# Patient Record
Sex: Male | Born: 1970 | Hispanic: No | Marital: Married | Smoking: Never smoker
Health system: Southern US, Community
[De-identification: ages and names within clinical notes are randomized; demographics above are authoritative.]

## PROBLEM LIST (undated history)

## (undated) DIAGNOSIS — M419 Scoliosis, unspecified: Secondary | ICD-10-CM

## (undated) HISTORY — DX: Scoliosis, unspecified: M41.9

---

## 2011-12-16 LAB — PULMONARY FUNCTION TEST

## 2012-01-12 ENCOUNTER — Encounter: Payer: Self-pay | Admitting: Emergency Medicine

## 2012-01-13 ENCOUNTER — Ambulatory Visit (INDEPENDENT_AMBULATORY_CARE_PROVIDER_SITE_OTHER)
Admission: RE | Admit: 2012-01-13 | Discharge: 2012-01-13 | Disposition: A | Discharge status: Home / Self Care | Payer: BC Managed Care – PPO | Source: Ambulatory Visit | Attending: Emergency Medicine | Admitting: Emergency Medicine

## 2012-01-13 ENCOUNTER — Ambulatory Visit (INDEPENDENT_AMBULATORY_CARE_PROVIDER_SITE_OTHER): Payer: BC Managed Care – PPO | Admitting: Emergency Medicine

## 2012-01-13 ENCOUNTER — Encounter: Payer: Self-pay | Admitting: Emergency Medicine

## 2012-01-13 VITALS — BP 110/76 | HR 57 | Temp 98.1°F | Ht 70.0 in | Wt 154.6 lb

## 2012-01-13 DIAGNOSIS — J984 Other disorders of lung: Secondary | ICD-10-CM

## 2012-01-13 NOTE — Progress Notes (Signed)
Subjective:     Patient ID: Luis Ellis, male   DOB: [birth year], 41 y.o.   MRN: [id number]  HPI 41 yo man, never smoker, no significant PMH except scoliosis. Underwent spirometry 12/06/11 for insurance application, showed restriction vs mixed disease. He is here to evaluate the test results. He denies any other sx, exercises regularly, no limitations. No wheeze, no cough, no CP. He does have a hx of fairly severe scoliosis that likely is the cause of his restriction.   Review of Systems  Constitutional: Negative for fever and unexpected weight change.  HENT: Negative for ear pain, nosebleeds, congestion, sore throat, rhinorrhea, sneezing, trouble swallowing, dental problem, postnasal drip and sinus pressure.   Eyes: Negative for redness and itching.  Respiratory: Negative for cough, chest tightness, shortness of breath and wheezing.   Cardiovascular: Negative for palpitations and leg swelling.  Gastrointestinal: Negative for nausea and vomiting.  Genitourinary: Negative for dysuria.  Musculoskeletal: Negative for joint swelling.  Skin: Negative for rash.  Neurological: Negative for headaches.  Hematological: Does not bruise/bleed easily.  Psychiatric/Behavioral: Negative for dysphoric mood. The patient is not nervous/anxious.    Past Medical History  Diagnosis Date  . Scoliosis      Family History  Problem Relation Age of Onset  . Cancer Maternal Grandmother     lung  . Diabetes Father      History   Social History  . Marital Status: Married    Spouse Name: N/A    Number of Children: 1  . Years of Education: N/A   Occupational History  . 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  No known TB exposure.   Social History Main Topics  . Smoking status: Former Games developer  . Smokeless tobacco: Never Used  . Alcohol Use: 0.5 oz/week    1 drink(s) per week     1 per day  . Drug Use: No  . Sexually Active: Not on file   Other Topics Concern  . Not on file   Social  History Narrative  . No narrative on file     No Known Allergies   No outpatient prescriptions prior to visit.         Objective:   Physical Exam Filed Vitals:   01/13/12 1342  BP: 110/76  Pulse: 57  Temp: 98.1 F (36.7 C)   Gen: Pleasant, well-nourished, in no distress,  normal affect  ENT: No lesions,  mouth clear,  oropharynx clear, no postnasal drip  Neck: No JVD, no TMG, no carotid bruits  Lungs: No use of accessory muscles, scoliosis with some kyphosis? 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 Could eval for obstruction further, but he has no sx of asthma, etc. I would defer for now. His restriction is almost certainly due to kyphosis/scoliosis. Need to r/u ILD, will screen with a CXR. Other potential cause would be resp muscle weakness but he has no other neuro sx. I think MIP/MEP would low yield. He tells me that he got the desired rate on his life insurance despite the spirometry findings, which seems appropriate.  - CXR now - f/u prn for any changes in his breathing,

## 2012-01-13 NOTE — Patient Instructions (Addendum)
We will perform CXR today Please contact and follow with Dr Delton Coombes if you develop any shortness of breath or changes in your status.

## 2012-01-13 NOTE — Assessment & Plan Note (Addendum)
Luis Ellis looks like restriction +/- mixed disease. Could eval for obstruction further, but he has no sx of asthma, etc. I would defer for now. His restriction is almost certainly due to kyphosis/scoliosis. Need to r/u ILD, will screen with a CXR. Other potential cause would be resp muscle weakness but he has no other neuro sx. I think MIP/MEP would low yield. He tells me that he got the desired rate on his life insurance despite the spirometry findings, which seems appropriate.  - CXR now - f/u prn for any changes in his breathing,

## 2012-01-13 NOTE — Progress Notes (Signed)
Quick Note:  ATC patient, no answer [id number] ______ 

## 2012-01-16 NOTE — Progress Notes (Signed)
Quick Note:  Spoke with patient, informed him of results per Dr. Delton Coombes as listed below. Verbalized understanding and nothing further needed at this time. ______

## 2012-01-31 ENCOUNTER — Encounter: Payer: Self-pay | Admitting: Emergency Medicine

## 2012-01-31 ENCOUNTER — Telehealth: Payer: Self-pay | Admitting: Emergency Medicine

## 2012-01-31 NOTE — Telephone Encounter (Signed)
Spoke with patient in regards to message left. Patient noticed on his MyChart account that we had him listed as Former Smoker and he wanted to verify that he is not a former smoker-he has never smoked. This information was changed within his medical chart and apology to patient in regards to this mistake. I also explained to the patient, while there are no excuses, this information was possibly gathered from his referring physician/PCP in the records that they faxed to [place]. I explained to pt that he might want to communicate with them as well to make sure that their information is also correct. Pt expressed understanding.

## 2013-01-17 ENCOUNTER — Other Ambulatory Visit: Payer: Self-pay

## 2013-01-22 IMAGING — CR DG CHEST 2V
2 series · 2 of 2 positions shown · non-contrast
Comparison: None.

CLINICAL DATA: Restrictive lung

CHEST - 2 VIEW

[view not recorded (1 of 2)]
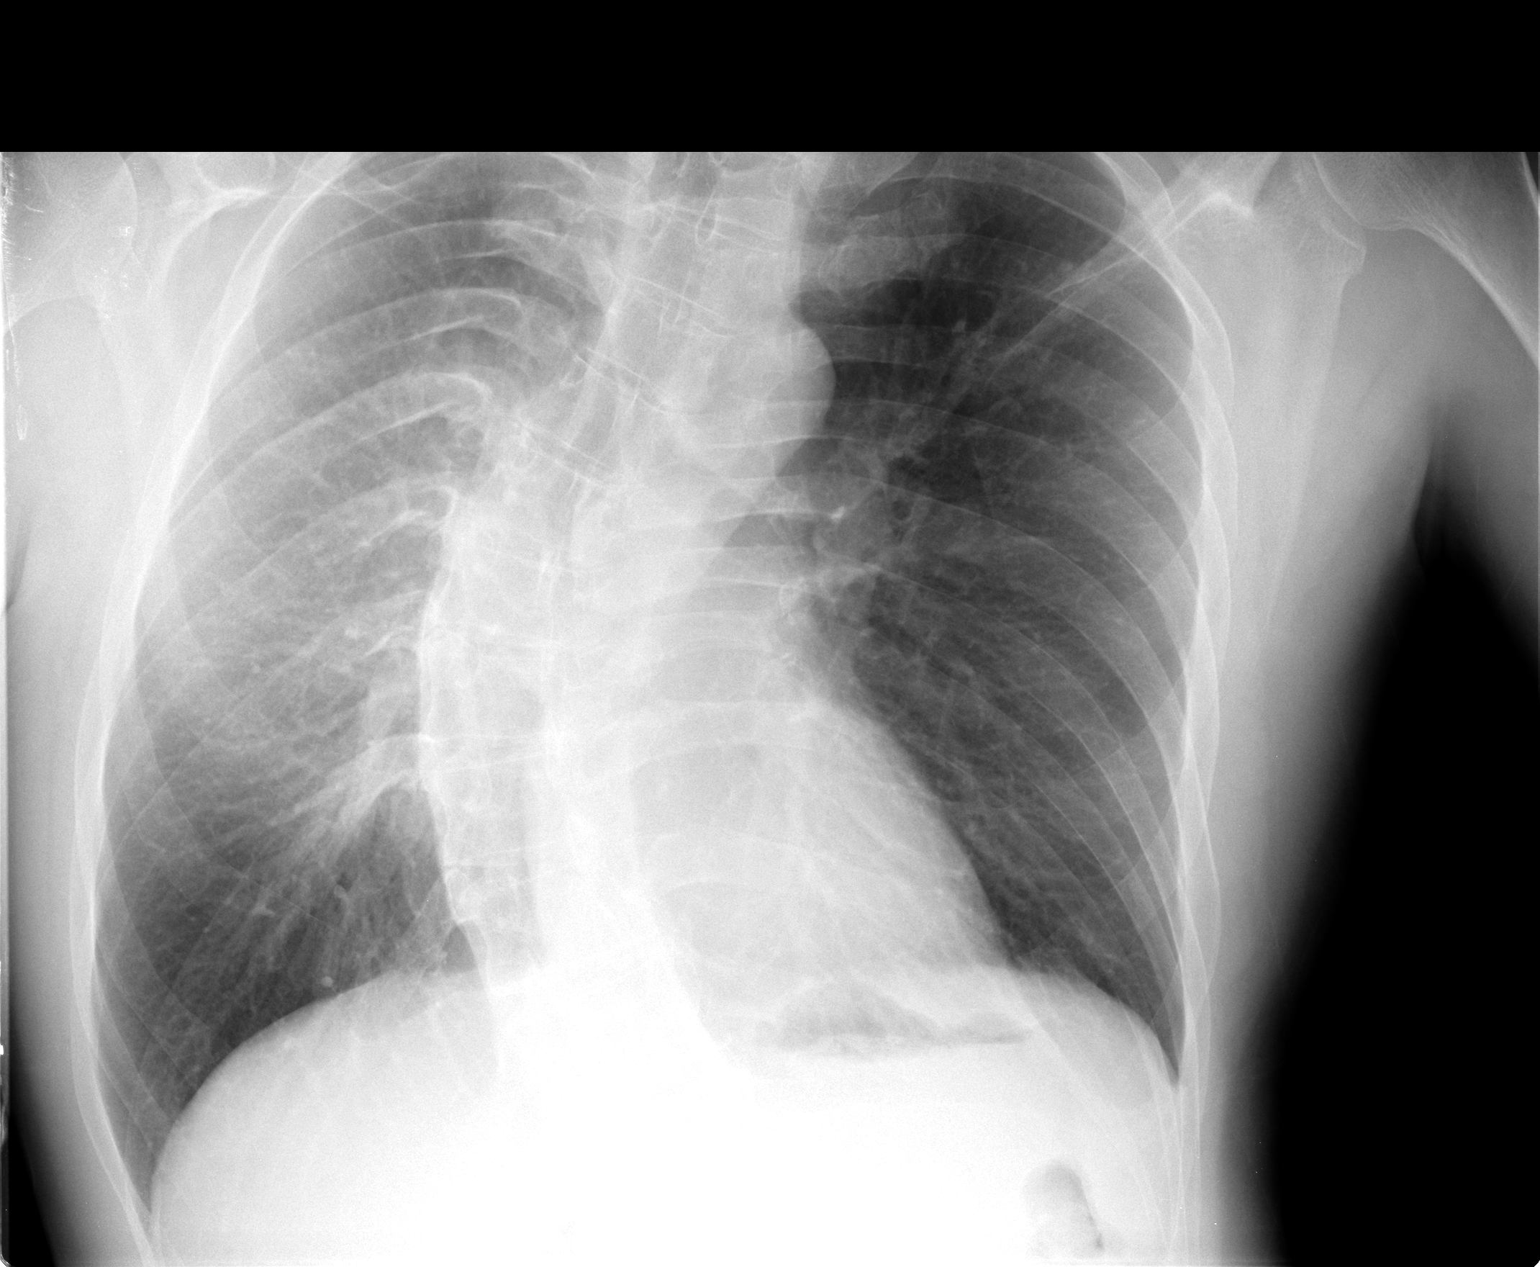

[view not recorded (2 of 2)]
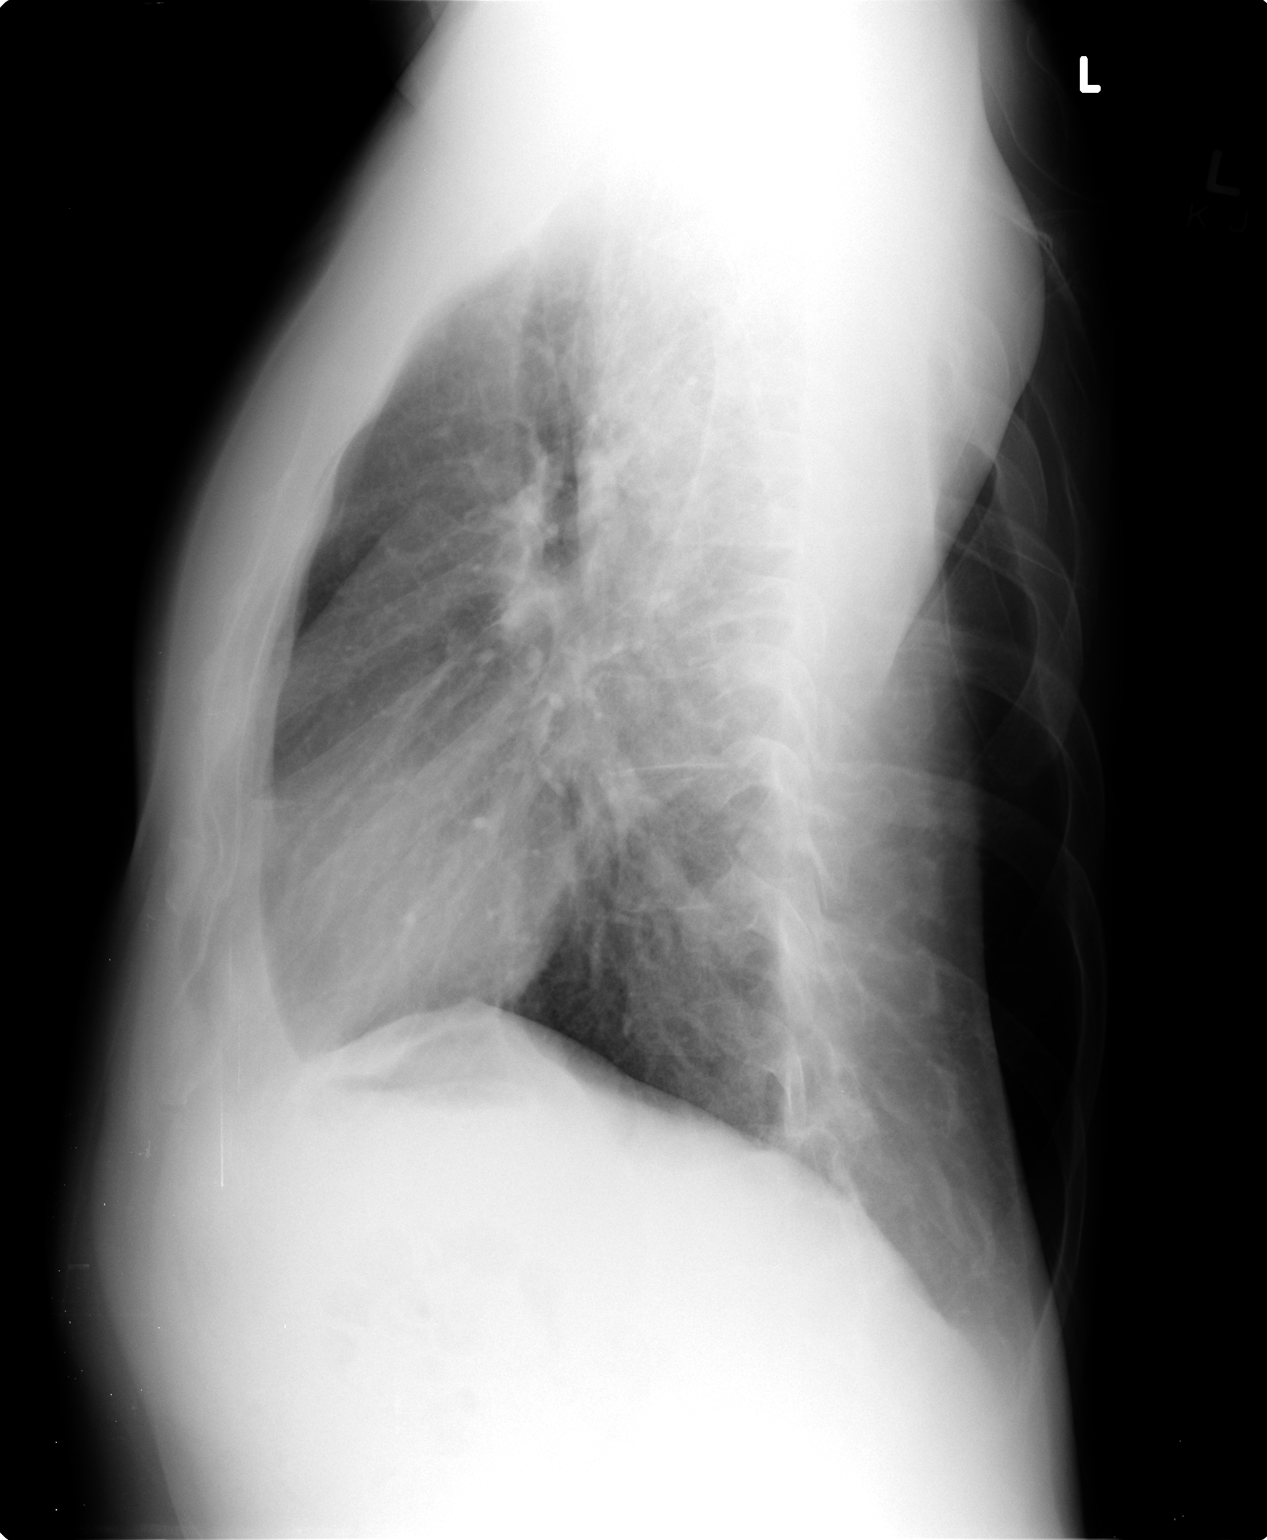

[2 of 2 positions shown; findings below may reference images not displayed]

FINDINGS: Marked C-shaped dextroconvex scoliosis of the thoracic
spine.  The lungs are clear.  The left lung is relatively
hyperinflated relative to the right secondary to the patient's
underlying scoliosis.  Cardiac and mediastinal contours within
normal limits.  No acute osseous abnormality identified.
IMPRESSION: 1.  Relative hyperinflation of the left lung relative to the right
secondary to underlying C-shaped dextroconvex scoliosis.
2.  No acute cardiopulmonary process.

## 2016-01-23 DIAGNOSIS — Z23 Encounter for immunization: Secondary | ICD-10-CM | POA: Diagnosis not present

## 2016-08-01 DIAGNOSIS — M545 Low back pain: Secondary | ICD-10-CM | POA: Diagnosis not present

## 2016-08-01 DIAGNOSIS — M25511 Pain in right shoulder: Secondary | ICD-10-CM | POA: Diagnosis not present

## 2016-08-01 DIAGNOSIS — M542 Cervicalgia: Secondary | ICD-10-CM | POA: Diagnosis not present

## 2016-08-01 DIAGNOSIS — M25512 Pain in left shoulder: Secondary | ICD-10-CM | POA: Diagnosis not present

## 2016-08-09 DIAGNOSIS — M542 Cervicalgia: Secondary | ICD-10-CM | POA: Diagnosis not present

## 2016-08-09 DIAGNOSIS — M25512 Pain in left shoulder: Secondary | ICD-10-CM | POA: Diagnosis not present

## 2016-08-09 DIAGNOSIS — M545 Low back pain: Secondary | ICD-10-CM | POA: Diagnosis not present

## 2016-08-09 DIAGNOSIS — M25511 Pain in right shoulder: Secondary | ICD-10-CM | POA: Diagnosis not present

## 2016-08-16 DIAGNOSIS — M545 Low back pain: Secondary | ICD-10-CM | POA: Diagnosis not present

## 2016-08-16 DIAGNOSIS — M542 Cervicalgia: Secondary | ICD-10-CM | POA: Diagnosis not present

## 2016-08-16 DIAGNOSIS — M25511 Pain in right shoulder: Secondary | ICD-10-CM | POA: Diagnosis not present

## 2016-08-16 DIAGNOSIS — M25512 Pain in left shoulder: Secondary | ICD-10-CM | POA: Diagnosis not present

## 2016-08-23 DIAGNOSIS — M25512 Pain in left shoulder: Secondary | ICD-10-CM | POA: Diagnosis not present

## 2016-08-23 DIAGNOSIS — M542 Cervicalgia: Secondary | ICD-10-CM | POA: Diagnosis not present

## 2016-08-23 DIAGNOSIS — M545 Low back pain: Secondary | ICD-10-CM | POA: Diagnosis not present

## 2016-08-23 DIAGNOSIS — M25511 Pain in right shoulder: Secondary | ICD-10-CM | POA: Diagnosis not present

## 2016-08-30 DIAGNOSIS — M25511 Pain in right shoulder: Secondary | ICD-10-CM | POA: Diagnosis not present

## 2016-08-30 DIAGNOSIS — M545 Low back pain: Secondary | ICD-10-CM | POA: Diagnosis not present

## 2016-08-30 DIAGNOSIS — M25512 Pain in left shoulder: Secondary | ICD-10-CM | POA: Diagnosis not present

## 2016-08-30 DIAGNOSIS — M542 Cervicalgia: Secondary | ICD-10-CM | POA: Diagnosis not present

## 2016-09-08 DIAGNOSIS — M25512 Pain in left shoulder: Secondary | ICD-10-CM | POA: Diagnosis not present

## 2016-09-08 DIAGNOSIS — M545 Low back pain: Secondary | ICD-10-CM | POA: Diagnosis not present

## 2016-09-08 DIAGNOSIS — M25511 Pain in right shoulder: Secondary | ICD-10-CM | POA: Diagnosis not present

## 2016-09-08 DIAGNOSIS — M542 Cervicalgia: Secondary | ICD-10-CM | POA: Diagnosis not present

## 2016-09-16 DIAGNOSIS — M25511 Pain in right shoulder: Secondary | ICD-10-CM | POA: Diagnosis not present

## 2016-09-16 DIAGNOSIS — M25512 Pain in left shoulder: Secondary | ICD-10-CM | POA: Diagnosis not present

## 2016-09-16 DIAGNOSIS — M545 Low back pain: Secondary | ICD-10-CM | POA: Diagnosis not present

## 2016-09-16 DIAGNOSIS — M542 Cervicalgia: Secondary | ICD-10-CM | POA: Diagnosis not present

## 2016-09-22 DIAGNOSIS — M25511 Pain in right shoulder: Secondary | ICD-10-CM | POA: Diagnosis not present

## 2016-09-22 DIAGNOSIS — M545 Low back pain: Secondary | ICD-10-CM | POA: Diagnosis not present

## 2016-09-22 DIAGNOSIS — M542 Cervicalgia: Secondary | ICD-10-CM | POA: Diagnosis not present

## 2016-09-22 DIAGNOSIS — M25512 Pain in left shoulder: Secondary | ICD-10-CM | POA: Diagnosis not present

## 2016-11-23 DIAGNOSIS — B029 Zoster without complications: Secondary | ICD-10-CM | POA: Diagnosis not present

## 2016-11-28 DIAGNOSIS — Z961 Presence of intraocular lens: Secondary | ICD-10-CM | POA: Diagnosis not present

## 2016-11-28 DIAGNOSIS — H43812 Vitreous degeneration, left eye: Secondary | ICD-10-CM | POA: Diagnosis not present

## 2016-12-16 DIAGNOSIS — H33012 Retinal detachment with single break, left eye: Secondary | ICD-10-CM | POA: Diagnosis not present

## 2016-12-22 DIAGNOSIS — H33012 Retinal detachment with single break, left eye: Secondary | ICD-10-CM | POA: Diagnosis not present

## 2016-12-23 DIAGNOSIS — H33012 Retinal detachment with single break, left eye: Secondary | ICD-10-CM | POA: Diagnosis not present

## 2017-04-28 DIAGNOSIS — C4441 Basal cell carcinoma of skin of scalp and neck: Secondary | ICD-10-CM | POA: Diagnosis not present

## 2017-05-26 DIAGNOSIS — C44319 Basal cell carcinoma of skin of other parts of face: Secondary | ICD-10-CM | POA: Diagnosis not present

## 2017-08-09 DIAGNOSIS — Z85828 Personal history of other malignant neoplasm of skin: Secondary | ICD-10-CM | POA: Diagnosis not present

## 2017-08-09 DIAGNOSIS — Z08 Encounter for follow-up examination after completed treatment for malignant neoplasm: Secondary | ICD-10-CM | POA: Diagnosis not present

## 2017-10-04 DIAGNOSIS — Z87898 Personal history of other specified conditions: Secondary | ICD-10-CM | POA: Diagnosis not present

## 2017-10-05 DIAGNOSIS — M4180 Other forms of scoliosis, site unspecified: Secondary | ICD-10-CM | POA: Diagnosis not present

## 2017-10-05 DIAGNOSIS — M545 Low back pain: Secondary | ICD-10-CM | POA: Diagnosis not present

## 2017-11-16 DIAGNOSIS — M545 Low back pain: Secondary | ICD-10-CM | POA: Diagnosis not present

## 2017-11-16 DIAGNOSIS — M4180 Other forms of scoliosis, site unspecified: Secondary | ICD-10-CM | POA: Diagnosis not present

## 2018-09-18 DIAGNOSIS — Z23 Encounter for immunization: Secondary | ICD-10-CM | POA: Diagnosis not present

## 2018-09-18 DIAGNOSIS — Z136 Encounter for screening for cardiovascular disorders: Secondary | ICD-10-CM | POA: Diagnosis not present

## 2018-09-18 DIAGNOSIS — Z0000 Encounter for general adult medical examination without abnormal findings: Secondary | ICD-10-CM | POA: Diagnosis not present

## 2018-09-18 DIAGNOSIS — Z125 Encounter for screening for malignant neoplasm of prostate: Secondary | ICD-10-CM | POA: Diagnosis not present

## 2018-09-18 DIAGNOSIS — Z209 Contact with and (suspected) exposure to unspecified communicable disease: Secondary | ICD-10-CM | POA: Diagnosis not present

## 2018-09-18 DIAGNOSIS — Z131 Encounter for screening for diabetes mellitus: Secondary | ICD-10-CM | POA: Diagnosis not present

## 2018-09-27 DIAGNOSIS — Z23 Encounter for immunization: Secondary | ICD-10-CM | POA: Diagnosis not present

## 2018-10-19 DIAGNOSIS — C44319 Basal cell carcinoma of skin of other parts of face: Secondary | ICD-10-CM | POA: Diagnosis not present

## 2018-11-13 DIAGNOSIS — C44319 Basal cell carcinoma of skin of other parts of face: Secondary | ICD-10-CM | POA: Diagnosis not present

## 2018-11-22 DIAGNOSIS — K5730 Diverticulosis of large intestine without perforation or abscess without bleeding: Secondary | ICD-10-CM | POA: Diagnosis not present

## 2018-11-22 DIAGNOSIS — K640 First degree hemorrhoids: Secondary | ICD-10-CM | POA: Diagnosis not present

## 2018-11-22 DIAGNOSIS — Z1211 Encounter for screening for malignant neoplasm of colon: Secondary | ICD-10-CM | POA: Diagnosis not present

## 2018-11-30 DIAGNOSIS — Z23 Encounter for immunization: Secondary | ICD-10-CM | POA: Diagnosis not present

## 2019-01-14 DIAGNOSIS — C44319 Basal cell carcinoma of skin of other parts of face: Secondary | ICD-10-CM | POA: Diagnosis not present

## 2019-05-13 ENCOUNTER — Ambulatory Visit: Payer: BC Managed Care – PPO | Attending: Internal Medicine

## 2019-05-13 DIAGNOSIS — Z20822 Contact with and (suspected) exposure to covid-19: Secondary | ICD-10-CM

## 2019-05-14 LAB — NOVEL CORONAVIRUS, NAA: SARS-CoV-2, NAA: NOT DETECTED

## 2019-06-03 ENCOUNTER — Ambulatory Visit: Payer: BC Managed Care – PPO | Attending: Internal Medicine

## 2019-06-03 DIAGNOSIS — Z20822 Contact with and (suspected) exposure to covid-19: Secondary | ICD-10-CM | POA: Diagnosis not present

## 2019-06-04 LAB — SARS-COV-2, NAA 2 DAY TAT

## 2019-06-04 LAB — NOVEL CORONAVIRUS, NAA: SARS-CoV-2, NAA: NOT DETECTED

## 2019-06-20 ENCOUNTER — Other Ambulatory Visit: Payer: Self-pay

## 2019-06-20 DIAGNOSIS — Z20822 Contact with and (suspected) exposure to covid-19: Secondary | ICD-10-CM | POA: Diagnosis not present

## 2019-06-21 LAB — SARS-COV-2, NAA 2 DAY TAT

## 2019-06-21 LAB — NOVEL CORONAVIRUS, NAA: SARS-CoV-2, NAA: NOT DETECTED

## 2019-07-18 ENCOUNTER — Other Ambulatory Visit: Payer: Self-pay

## 2019-07-18 ENCOUNTER — Ambulatory Visit: Payer: BC Managed Care – PPO | Attending: Internal Medicine

## 2019-07-18 DIAGNOSIS — Z20822 Contact with and (suspected) exposure to covid-19: Secondary | ICD-10-CM | POA: Diagnosis not present

## 2019-07-19 LAB — NOVEL CORONAVIRUS, NAA: SARS-CoV-2, NAA: NOT DETECTED

## 2019-07-19 LAB — SARS-COV-2, NAA 2 DAY TAT

## 2019-07-23 ENCOUNTER — Ambulatory Visit: Payer: BC Managed Care – PPO | Attending: Internal Medicine

## 2019-07-23 ENCOUNTER — Other Ambulatory Visit: Payer: Self-pay

## 2019-07-23 DIAGNOSIS — Z20822 Contact with and (suspected) exposure to covid-19: Secondary | ICD-10-CM

## 2019-07-24 LAB — SARS-COV-2, NAA 2 DAY TAT

## 2019-07-24 LAB — NOVEL CORONAVIRUS, NAA: SARS-CoV-2, NAA: NOT DETECTED

## 2019-10-04 DIAGNOSIS — Z0000 Encounter for general adult medical examination without abnormal findings: Secondary | ICD-10-CM | POA: Diagnosis not present

## 2019-10-04 DIAGNOSIS — E7800 Pure hypercholesterolemia, unspecified: Secondary | ICD-10-CM | POA: Diagnosis not present

## 2019-10-04 DIAGNOSIS — Z125 Encounter for screening for malignant neoplasm of prostate: Secondary | ICD-10-CM | POA: Diagnosis not present

## 2019-10-04 DIAGNOSIS — M25511 Pain in right shoulder: Secondary | ICD-10-CM | POA: Diagnosis not present

## 2019-10-23 DIAGNOSIS — M7501 Adhesive capsulitis of right shoulder: Secondary | ICD-10-CM | POA: Diagnosis not present

## 2019-11-01 DIAGNOSIS — M7501 Adhesive capsulitis of right shoulder: Secondary | ICD-10-CM | POA: Diagnosis not present

## 2019-11-21 DIAGNOSIS — M7501 Adhesive capsulitis of right shoulder: Secondary | ICD-10-CM | POA: Diagnosis not present

## 2019-11-27 DIAGNOSIS — M7501 Adhesive capsulitis of right shoulder: Secondary | ICD-10-CM | POA: Diagnosis not present

## 2019-12-02 DIAGNOSIS — M7501 Adhesive capsulitis of right shoulder: Secondary | ICD-10-CM | POA: Diagnosis not present

## 2019-12-04 DIAGNOSIS — M7501 Adhesive capsulitis of right shoulder: Secondary | ICD-10-CM | POA: Diagnosis not present

## 2020-01-16 DIAGNOSIS — Z23 Encounter for immunization: Secondary | ICD-10-CM | POA: Diagnosis not present

## 2020-04-20 DIAGNOSIS — D229 Melanocytic nevi, unspecified: Secondary | ICD-10-CM | POA: Diagnosis not present

## 2020-04-20 DIAGNOSIS — L570 Actinic keratosis: Secondary | ICD-10-CM | POA: Diagnosis not present

## 2020-04-20 DIAGNOSIS — D1801 Hemangioma of skin and subcutaneous tissue: Secondary | ICD-10-CM | POA: Diagnosis not present

## 2020-04-20 DIAGNOSIS — D485 Neoplasm of uncertain behavior of skin: Secondary | ICD-10-CM | POA: Diagnosis not present

## 2020-05-20 ENCOUNTER — Other Ambulatory Visit: Payer: BC Managed Care – PPO

## 2020-05-20 DIAGNOSIS — Z20822 Contact with and (suspected) exposure to covid-19: Secondary | ICD-10-CM | POA: Diagnosis not present

## 2020-05-21 ENCOUNTER — Other Ambulatory Visit: Payer: BC Managed Care – PPO

## 2020-05-21 LAB — SARS-COV-2, NAA 2 DAY TAT

## 2020-05-21 LAB — NOVEL CORONAVIRUS, NAA: SARS-CoV-2, NAA: NOT DETECTED

## 2020-06-01 ENCOUNTER — Other Ambulatory Visit (HOSPITAL_COMMUNITY): Payer: BC Managed Care – PPO

## 2020-06-01 ENCOUNTER — Ambulatory Visit: Payer: BC Managed Care – PPO | Attending: Critical Care Medicine

## 2020-06-01 DIAGNOSIS — Z20822 Contact with and (suspected) exposure to covid-19: Secondary | ICD-10-CM | POA: Diagnosis not present

## 2020-06-02 LAB — NOVEL CORONAVIRUS, NAA: SARS-CoV-2, NAA: DETECTED — AB

## 2020-06-02 LAB — SARS-COV-2, NAA 2 DAY TAT

## 2020-06-03 ENCOUNTER — Telehealth: Payer: Self-pay

## 2020-06-03 NOTE — Telephone Encounter (Signed)
Called to discuss with patient about COVID-19 symptoms and the use of one of the available treatments for those with mild to moderate Covid symptoms and at a high risk of hospitalization.  Pt appears to qualify for outpatient treatment due to co-morbid conditions and/or a member of an at-risk group in accordance with the FDA Emergency Use Authorization.    Symptom onset: Unknown Vaccinated: Unknown Booster? Unknown Immunocompromised? No Qualifiers: Respiratory disease  Unable to reach pt - Left message and call back number [phone].  Esther Hardy

## 2020-06-11 DIAGNOSIS — L570 Actinic keratosis: Secondary | ICD-10-CM | POA: Diagnosis not present

## 2020-10-05 DIAGNOSIS — Z0000 Encounter for general adult medical examination without abnormal findings: Secondary | ICD-10-CM | POA: Diagnosis not present

## 2020-10-05 DIAGNOSIS — R5383 Other fatigue: Secondary | ICD-10-CM | POA: Diagnosis not present

## 2020-10-05 DIAGNOSIS — Z125 Encounter for screening for malignant neoplasm of prostate: Secondary | ICD-10-CM | POA: Diagnosis not present

## 2020-10-05 DIAGNOSIS — E7800 Pure hypercholesterolemia, unspecified: Secondary | ICD-10-CM | POA: Diagnosis not present

## 2020-10-19 DIAGNOSIS — L905 Scar conditions and fibrosis of skin: Secondary | ICD-10-CM | POA: Diagnosis not present

## 2020-10-19 DIAGNOSIS — L570 Actinic keratosis: Secondary | ICD-10-CM | POA: Diagnosis not present

## 2020-10-19 DIAGNOSIS — Z85828 Personal history of other malignant neoplasm of skin: Secondary | ICD-10-CM | POA: Diagnosis not present

## 2020-12-14 DIAGNOSIS — Z872 Personal history of diseases of the skin and subcutaneous tissue: Secondary | ICD-10-CM | POA: Diagnosis not present

## 2020-12-14 DIAGNOSIS — L578 Other skin changes due to chronic exposure to nonionizing radiation: Secondary | ICD-10-CM | POA: Diagnosis not present

## 2020-12-14 DIAGNOSIS — Z09 Encounter for follow-up examination after completed treatment for conditions other than malignant neoplasm: Secondary | ICD-10-CM | POA: Diagnosis not present
